# Patient Record
Sex: Male | Born: 1945 | Hispanic: No | Marital: Single | Smoking: Former smoker
Health system: Southern US, Community
[De-identification: ages and names within clinical notes are randomized; demographics above are authoritative.]

## PROBLEM LIST (undated history)

## (undated) DIAGNOSIS — I10 Essential (primary) hypertension: Secondary | ICD-10-CM

## (undated) DIAGNOSIS — F4310 Post-traumatic stress disorder, unspecified: Secondary | ICD-10-CM

## (undated) DIAGNOSIS — N189 Chronic kidney disease, unspecified: Secondary | ICD-10-CM

## (undated) DIAGNOSIS — Z89612 Acquired absence of left leg above knee: Secondary | ICD-10-CM

## (undated) DIAGNOSIS — L89159 Pressure ulcer of sacral region, unspecified stage: Secondary | ICD-10-CM

## (undated) DIAGNOSIS — R0600 Dyspnea, unspecified: Secondary | ICD-10-CM

## (undated) DIAGNOSIS — J189 Pneumonia, unspecified organism: Secondary | ICD-10-CM

## (undated) DIAGNOSIS — I2510 Atherosclerotic heart disease of native coronary artery without angina pectoris: Secondary | ICD-10-CM

## (undated) DIAGNOSIS — J449 Chronic obstructive pulmonary disease, unspecified: Secondary | ICD-10-CM

## (undated) DIAGNOSIS — D649 Anemia, unspecified: Secondary | ICD-10-CM

## (undated) DIAGNOSIS — E119 Type 2 diabetes mellitus without complications: Secondary | ICD-10-CM

---

## 2015-09-11 DIAGNOSIS — T148 Other injury of unspecified body region: Secondary | ICD-10-CM | POA: Insufficient documentation

## 2015-09-11 DIAGNOSIS — A4902 Methicillin resistant Staphylococcus aureus infection, unspecified site: Secondary | ICD-10-CM | POA: Insufficient documentation

## 2020-09-23 DIAGNOSIS — E43 Unspecified severe protein-calorie malnutrition: Secondary | ICD-10-CM | POA: Diagnosis present

## 2020-09-23 DIAGNOSIS — L89154 Pressure ulcer of sacral region, stage 4: Secondary | ICD-10-CM | POA: Diagnosis present

## 2020-09-23 DIAGNOSIS — J9621 Acute and chronic respiratory failure with hypoxia: Secondary | ICD-10-CM | POA: Diagnosis present

## 2020-09-23 DIAGNOSIS — N179 Acute kidney failure, unspecified: Secondary | ICD-10-CM

## 2020-09-23 DIAGNOSIS — U071 COVID-19: Secondary | ICD-10-CM

## 2020-09-23 DIAGNOSIS — A419 Sepsis, unspecified organism: Principal | ICD-10-CM

## 2020-09-23 DIAGNOSIS — Z89619 Acquired absence of unspecified leg above knee: Secondary | ICD-10-CM

## 2020-09-23 DIAGNOSIS — R0902 Hypoxemia: Secondary | ICD-10-CM | POA: Diagnosis present

## 2020-09-23 DIAGNOSIS — J189 Pneumonia, unspecified organism: Secondary | ICD-10-CM

## 2020-09-23 HISTORY — DX: Chronic obstructive pulmonary disease, unspecified: J44.9

## 2020-09-23 HISTORY — DX: Pneumonia, unspecified organism: J18.9

## 2020-09-23 HISTORY — DX: Atherosclerotic heart disease of native coronary artery without angina pectoris: I25.10

## 2020-09-23 HISTORY — DX: Essential (primary) hypertension: I10

## 2020-09-23 HISTORY — DX: Acquired absence of left leg above knee: Z89.612

## 2020-09-23 HISTORY — DX: Dyspnea, unspecified: R06.00

## 2020-09-23 HISTORY — DX: Chronic kidney disease, unspecified: N18.9

## 2020-09-23 HISTORY — DX: Post-traumatic stress disorder, unspecified: F43.10

## 2020-09-23 HISTORY — DX: Pressure ulcer of sacral region, unspecified stage: L89.159

## 2020-09-23 HISTORY — DX: Anemia, unspecified: D64.9

## 2020-09-23 HISTORY — DX: Type 2 diabetes mellitus without complications: E11.9

## 2020-09-23 LAB — CBC WITH DIFFERENTIAL/PLATELET
Basophils Relative: 0 %
Immature Granulocytes: 1 %
Lymphocytes Relative: 20 %
MCHC: 29.5 g/dL — ABNORMAL LOW (ref 30.0–36.0)
Monocytes Absolute: 0.9 10*3/uL (ref 0.1–1.0)
Monocytes Relative: 5 %
WBC: 17.6 10*3/uL — ABNORMAL HIGH (ref 4.0–10.5)

## 2020-09-23 LAB — COMPREHENSIVE METABOLIC PANEL
AST: 44 U/L — ABNORMAL HIGH (ref 15–41)
BUN: 60 mg/dL — ABNORMAL HIGH (ref 8–23)
GFR, Estimated: 14 mL/min — ABNORMAL LOW (ref >60)
Glucose, Bld: 126 mg/dL — ABNORMAL HIGH (ref 70–99)
Total Protein: 7.5 g/dL (ref 6.5–8.1)

## 2020-09-23 LAB — PROTIME-INR: Prothrombin Time: 15 seconds (ref 11.4–15.2)

## 2020-09-23 NOTE — Consult Note (Incomplete)
Reason for Consult:*** Referring Physician: ***  Steve Griffin. is an 75 y.o. male.  HPI: ***  Past Medical History:  Diagnosis Date  . CKD (chronic kidney disease)   . Diabetes mellitus without complication (HCC)   . Hx of AKA (above knee amputation), left (HCC)   . Hypertension   . Pressure ulcer, sacrum   . PTSD (post-traumatic stress disorder)     *** The histories are not reviewed yet. Please review them in the "History" navigator section and refresh this SmartLink.  No family history on file.  Social History:  reports that he has been smoking. He does not have any smokeless tobacco history on file. No history on file for alcohol use and drug use.  Allergies: Not on File  Medications: {medication reviewed/display:[id number]}  Results for orders placed or performed during the hospital encounter of 09/23/20 (from the past 48 hour(s))  Lactic acid, plasma     Status: None   Collection Time: 09/23/20 10:35 AM  Result Value Ref Range   Lactic Acid, Venous 1.5 0.5 - 1.9 mmol/L    Comment: Performed at [facility], [address] [place], [place] [address]  Comprehensive metabolic panel     Status: Abnormal   Collection Time: 09/23/20 10:35 AM  Result Value Ref Range   Sodium 136 135 - 145 mmol/L   Potassium 5.4 (H) 3.5 - 5.1 mmol/L   Chloride 102 98 - 111 mmol/L   CO2 21 (L) 22 - 32 mmol/L   Glucose, Bld 126 (H) 70 - 99 mg/dL    Comment: Glucose reference range applies only to samples taken after fasting for at least 8 hours.   BUN 60 (H) 8 - 23 mg/dL   Creatinine, Ser 0.97 (H) 0.61 - 1.24 mg/dL   Calcium 8.6 (L) 8.9 - 10.3 mg/dL   Total Protein 7.5 6.5 - 8.1 g/dL   Albumin 1.7 (L) 3.5 - 5.0 g/dL   AST 44 (H) 15 - 41 U/L   ALT 21 0 - 44 U/L   Alkaline Phosphatase 91 38 - 126 U/L   Total Bilirubin 0.5 0.3 - 1.2 mg/dL   GFR, Estimated 14 (L) >60 mL/min    Comment: (NOTE) Calculated using the CKD-EPI Creatinine Equation (2021)    Anion gap 13 5 - 15     Comment: Performed at [facility], [address] [place], [place] [address]  CBC WITH DIFFERENTIAL     Status: Abnormal   Collection Time: 09/23/20 10:35 AM  Result Value Ref Range   WBC 17.6 (H) 4.0 - 10.5 K/uL   RBC 2.84 (L) 4.22 - 5.81 MIL/uL   Hemoglobin 7.1 (L) 13.0 - 17.0 g/dL   HCT 92.4 (L) 26.8 - 34.1 %   MCV 84.9 80.0 - 100.0 fL   MCH 25.0 (L) 26.0 - 34.0 pg   MCHC 29.5 (L) 30.0 - 36.0 g/dL   RDW 96.2 (H) 22.9 - 79.8 %   Platelets 641 (H) 150 - 400 K/uL   nRBC 0.0 0.0 - 0.2 %   Neutrophils Relative % 74 %   Neutro Abs 12.8 (H) 1.7 - 7.7 K/uL   Lymphocytes Relative 20 %   Lymphs Abs 3.4 0.7 - 4.0 K/uL   Monocytes Relative 5 %   Monocytes Absolute 0.9 0.1 - 1.0 K/uL   Eosinophils Relative 0 %   Eosinophils Absolute 0.0 0.0 - 0.5 K/uL   Basophils Relative 0 %   Basophils Absolute 0.0 0.0 - 0.1  K/uL   WBC Morphology MORPHOLOGY UNREMARKABLE    RBC Morphology MIXED RBC MORPHOLOGY    Smear Review Normal platelet morphology    Immature Granulocytes 1 %   Abs Immature Granulocytes 0.23 (H) 0.00 - 0.07 K/uL    Comment: Performed at [facility], [address] [place], [place] [address]  Protime-INR     Status: None   Collection Time: 09/23/20 10:35 AM  Result Value Ref Range   Prothrombin Time 15.0 11.4 - 15.2 seconds   INR 1.2 0.8 - 1.2    Comment: (NOTE) INR goal varies based on device and disease states. Performed at [facility], [address] [place], [place] [address]   APTT     Status: Abnormal   Collection Time: 09/23/20 10:35 AM  Result Value Ref Range   aPTT 41 (H) 24 - 36 seconds    Comment:        IF BASELINE aPTT IS ELEVATED, SUGGEST PATIENT RISK ASSESSMENT BE USED TO DETERMINE APPROPRIATE ANTICOAGULANT THERAPY. Performed at [facility], [address] [place], [place] [address]   Ammonia     Status: None   Collection Time: 09/23/20 10:35 AM  Result Value Ref Range   Ammonia 20 9 - 35 umol/L     Comment: HEMOLYSIS AT THIS LEVEL MAY AFFECT RESULT Performed at [facility], [address] [place], [place] [address]   SARS Coronavirus 2 by RT PCR (hospital order, performed in [facility] hospital lab) Nasopharyngeal Nasopharyngeal Swab     Status: Abnormal   Collection Time: 09/23/20 10:35 AM   Specimen: Nasopharyngeal Swab  Result Value Ref Range   SARS Coronavirus 2 POSITIVE (A) NEGATIVE    Comment: RESULT CALLED TO, READ BACK BY AND VERIFIED WITH: REED RENO RN AT 1133 ON 09/23/20 SNG (NOTE) SARS-CoV-2 target nucleic acids are DETECTED  SARS-CoV-2 RNA is generally detectable in upper respiratory specimens  during the acute phase of infection.  Positive results are indicative  of the presence of the identified virus, but do not rule out bacterial infection or co-infection with other pathogens not detected by the test.  Clinical correlation with patient history and  other diagnostic information is necessary to determine patient infection status.  The expected result is negative.  Fact Sheet for Patients:   BoilerBrush.com.cy   Fact Sheet for Healthcare Providers:   https://pope.com/    This test is not yet approved or cleared by the [place] FDA and  has been authorized for detection and/or diagnosis of SARS-CoV-2 by FDA under an Emergency Use Authorization (EUA).  This EUA will remain in effect (meaning this te st can be used) for the duration of  the COVID-19 declaration under Section 564(b)(1) of the Act, 21 U.S.C. section 360-bbb-3(b)(1), unless the authorization is terminated or revoked sooner.  Performed at [facility], [address] [place], [place] [address]   Procalcitonin - Baseline     Status: None   Collection Time: 09/23/20 10:35 AM  Result Value Ref Range   Procalcitonin 1.91 ng/mL    Comment:        Interpretation: PCT > 0.5 ng/mL and <= 2 ng/mL: Systemic infection (sepsis) is  possible, but other conditions are known to elevate PCT as well. (NOTE)       Sepsis PCT Algorithm           Lower Respiratory Tract  Infection PCT Algorithm    ----------------------------     ----------------------------         PCT < 0.25 ng/mL                PCT < 0.10 ng/mL          Strongly encourage             Strongly discourage   discontinuation of antibiotics    initiation of antibiotics    ----------------------------     -----------------------------       PCT 0.25 - 0.50 ng/mL            PCT 0.10 - 0.25 ng/mL               OR       >80% decrease in PCT            Discourage initiation of                                            antibiotics      Encourage discontinuation           of antibiotics    ----------------------------     -----------------------------         PCT >= 0.50 ng/mL              PCT 0.26 - 0.50 ng/mL                AND       <80% decrease in PCT             Encourage initiation of                                             antibiotics       Encourage continuation           of antibiotics    ----------------------------     -----------------------------        PCT >= 0.50 ng/mL                  PCT > 0.50 ng/mL               AND         increase in PCT                  Strongly encourage                                      initiation of antibiotics    Strongly encourage escalation           of antibiotics                                     -----------------------------                                           PCT <= 0.25 ng/mL  OR                                        > 80% decrease in PCT                                      Discontinue / Do not initiate                                             antibiotics  Performed at [facility], [address] [place], [place] [address]   Blood gas, venous     Status: Abnormal (Preliminary result)    Collection Time: 09/23/20 10:42 AM  Result Value Ref Range   pH, Ven 7.30 7.250 - 7.430   pCO2, Ven 45 44.0 - 60.0 mmHg   pO2, Ven PENDING 32.0 - 45.0 mmHg   Bicarbonate 22.1 20.0 - 28.0 mmol/L   Acid-base deficit 4.0 (H) 0.0 - 2.0 mmol/L   O2 Saturation 25.8 %   Patient temperature 37.0    Sample type VENOUS     Comment: Performed at [facility], [address], [place], [place] [address]  CBG monitoring, ED     Status: Abnormal   Collection Time: 09/23/20 11:01 AM  Result Value Ref Range   Glucose-Capillary 280 (H) 70 - 99 mg/dL    Comment: Glucose reference range applies only to samples taken after fasting for at least 8 hours.    DG Chest Port 1 View  Result Date: 09/23/2020 CLINICAL DATA:  Respiratory distress and altered mental status. EXAM: PORTABLE CHEST 1 VIEW COMPARISON:  None. FINDINGS: Atherosclerotic calcification of the aortic arch. Tortuous thoracic aorta. Heart size within normal limits for projection. Abnormal blunting of the left lateral costophrenic angle. Linear bandlike scarring peripherally in the left lung base. There is some hazy density at the left lung base, possibly from layering pleural fluid. Volume loss at the left lung base. The right lung appears clear. IMPRESSION: 1. Hazy density at the left lung base, potentially from layering pleural fluid. Blunted left lateral costophrenic angle. 2. Scarring peripherally in the left lung base with some mild left basilar volume loss. 3. Tortuous thoracic aorta.  Aortic Atherosclerosis (ICD10-I70.0). Electronically Signed   By: Gaylyn Rong M.D.   On: 09/23/2020 11:23    Review of Systems Blood pressure 131/64, pulse 85, temperature (!) 97.1 F (36.2 C), temperature source Axillary, resp. rate (!) 22, weight 65.8 kg, SpO2 100 %. Physical Exam  Assessment/Plan: ***  Sarina Ser 09/23/2020, 3:42 PM

## 2020-09-26 LAB — CULTURE, BLOOD (SINGLE)

## 2020-11-15 DEATH — deceased

## 2021-09-22 IMAGING — CT CT CHEST W/O CM
2 of 3 series · 15 of 36 positions shown, 18 images · non-contrast
Comparison: 09/23/2020

CLINICAL DATA: Respiratory failure, D2LU6-D8 positive, renal
insufficiency, diabetes

EXAM:
CT CHEST WITHOUT CONTRAST
TECHNIQUE: Multidetector CT imaging of the chest was performed following the
standard protocol without IV contrast.

[Series 3: thorax · axial · 0.66mm/px · z∈[+49,+309]mm · 12 of 154 slices shown, 15 images]
[im 12/154  mediastinal]
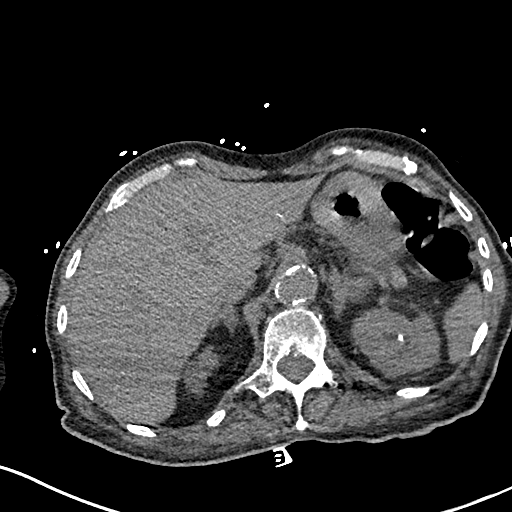
[im 12/154  lung]
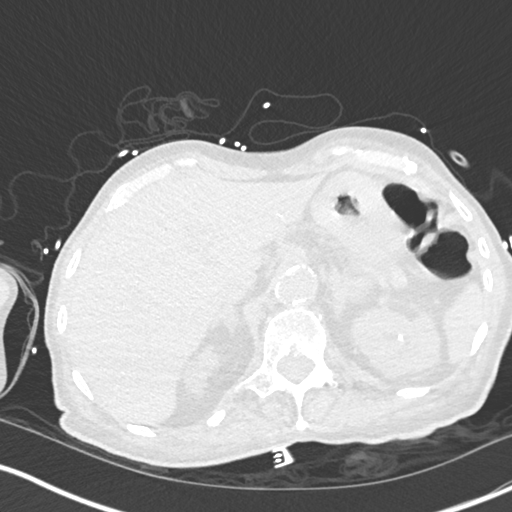
[im 23/154  lung]
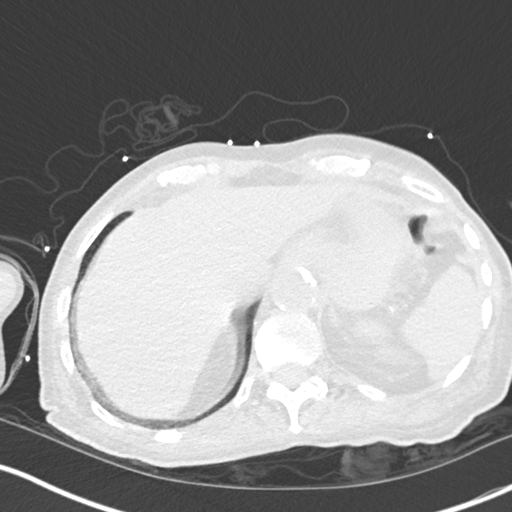
[im 35/154  lung]
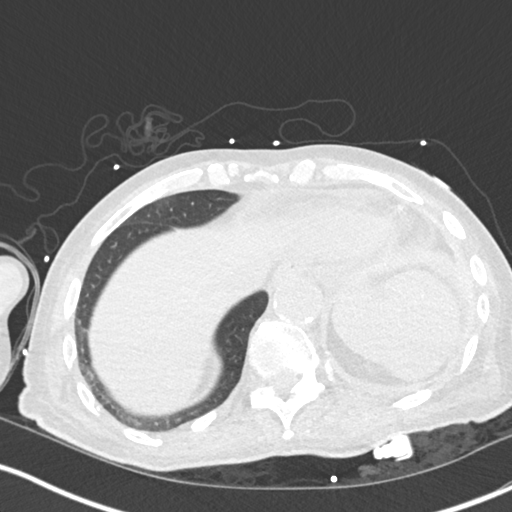
[im 46/154  lung]
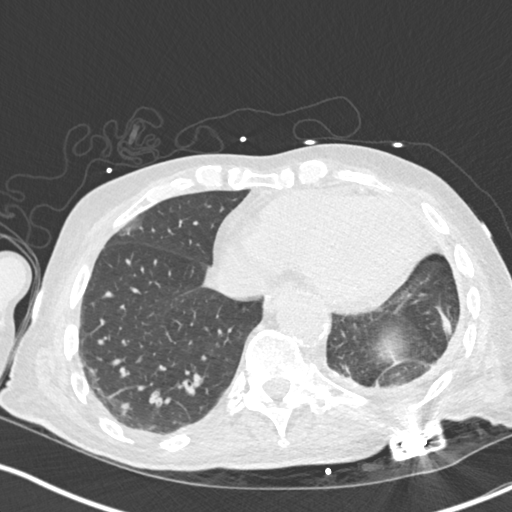
[im 57/154  mediastinal]
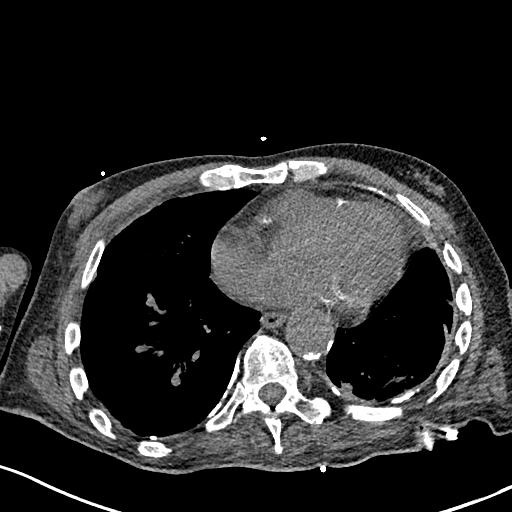
[im 57/154  lung]
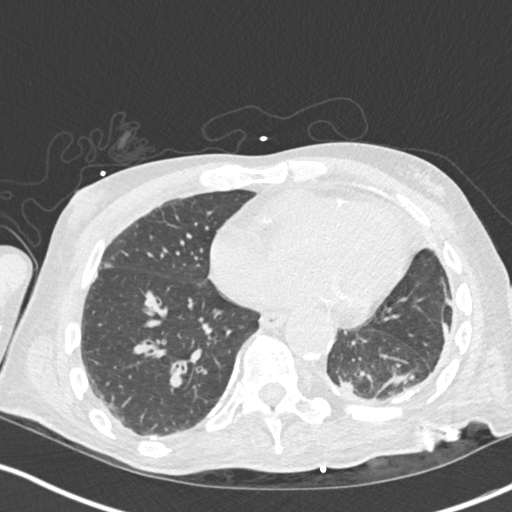
[im 69/154  lung]
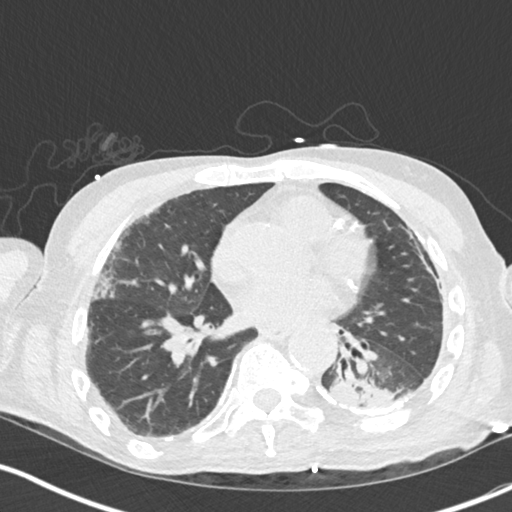
[im 86/154  lung]
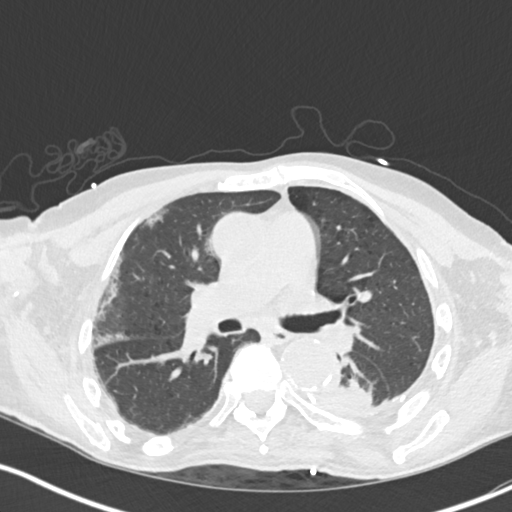
[im 97/154  lung]
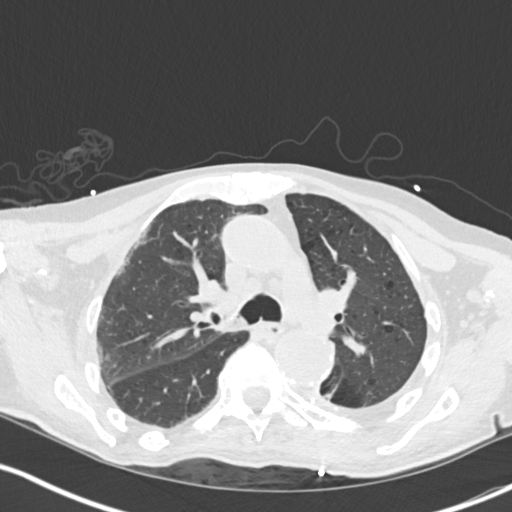
[im 108/154  mediastinal]
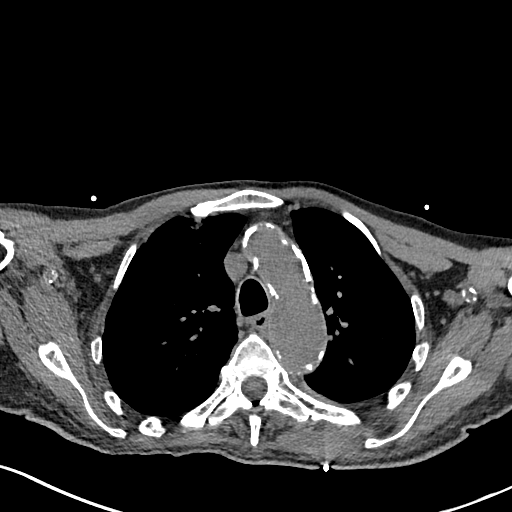
[im 108/154  lung]
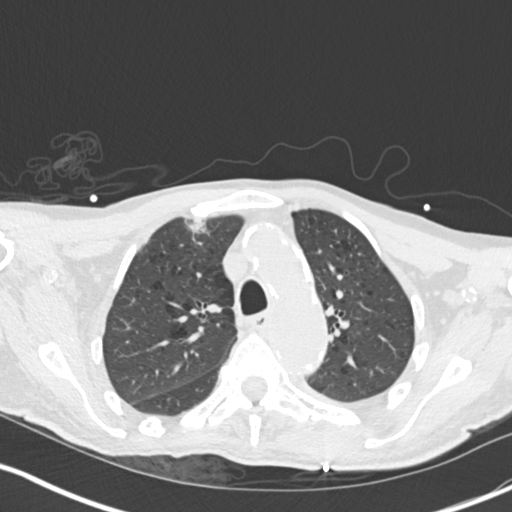
[im 120/154  lung]
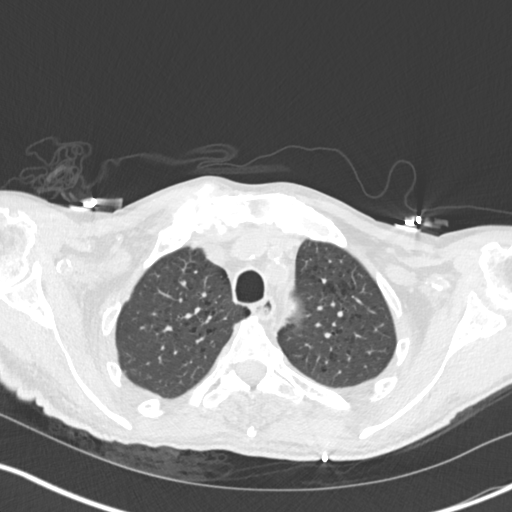
[im 131/154  lung]
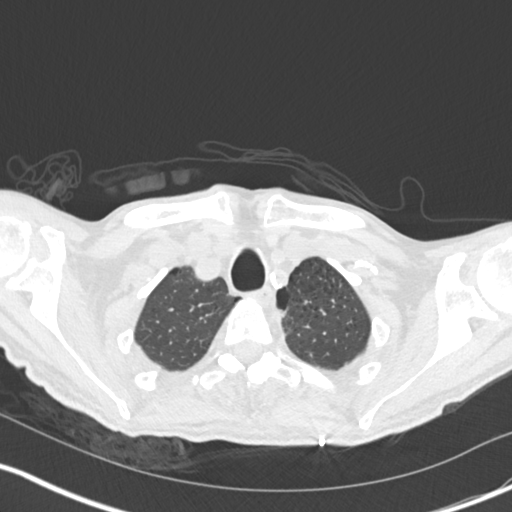
[im 142/154  lung]
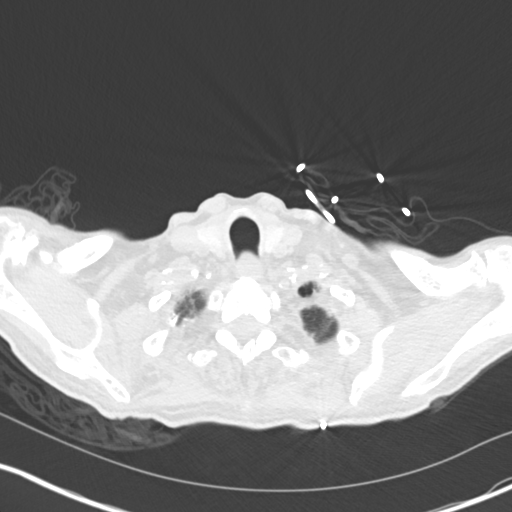

[Series 6: coronal · coronal · 0.63mm/px · 3 of 115 slices shown]
[im 23/115  lung]
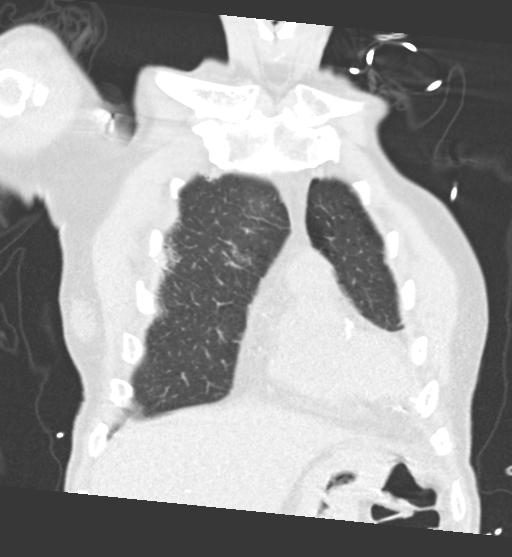
[im 46/115  lung]
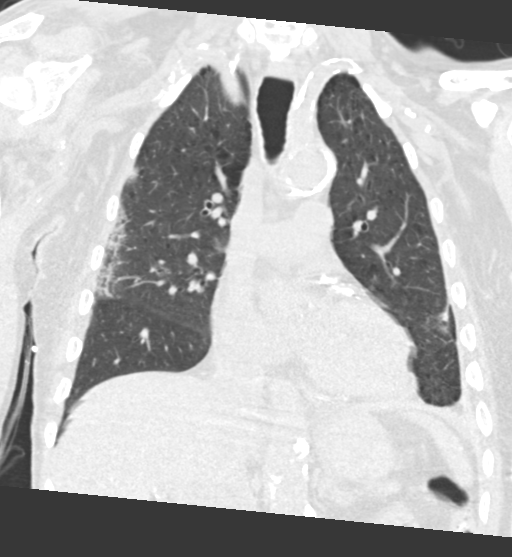
[im 69/115  lung]
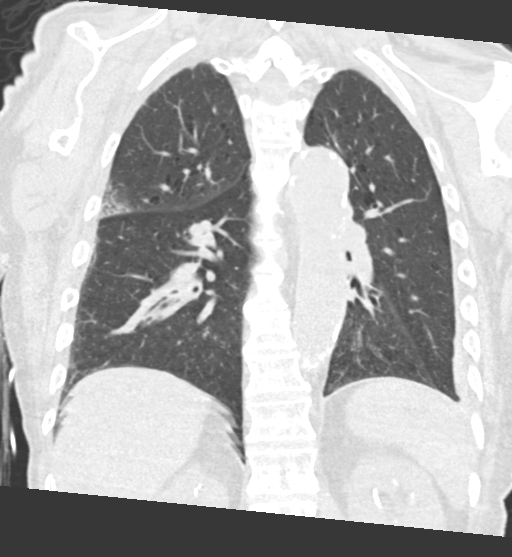

[15 of 36 positions shown; findings below may reference images not displayed]

FINDINGS: Cardiovascular: Unenhanced imaging of the heart and great vessels
demonstrates no significant pericardial effusion. No evidence of
thoracic aortic aneurysm. Evaluation of the aortic lumen is limited
without IV contrast. Diffuse atherosclerosis of the aorta and
coronary vasculature.

Mediastinum/Nodes: No enlarged mediastinal or axillary lymph nodes.
Thyroid gland, trachea, and esophagus demonstrate no significant
findings.

Lungs/Pleura: Upper lobe predominant emphysema. There are scattered
areas of subpleural ground-glass airspace disease within the right
upper and right middle lobes, compatible with early infection.

There is circumferential left pleural thickening and calcification,
consistent with prior surgery or trauma. Rounded consolidation
within the left lower lobe most compatible with rounded atelectasis.

No effusion or pneumothorax.

There is mucoid material within the right mainstem bronchus. Right
lower lobe bronchial wall thickening is identified consistent with
bronchitis or reactive airway disease.

Upper Abdomen: No acute abnormality.

Musculoskeletal: No acute or destructive bony lesions.
IMPRESSION: 1. Upper lobe predominant emphysema, with patchy right upper and
right middle lobe subpleural ground-glass airspace disease
compatible with early infection or inflammation.
2. Right lower lobe bronchial wall thickening consistent with
bronchitis or reactive airway disease.
3. Left pleural thickening and calcification consistent with
previous trauma or surgery. Rounded left lower lobe consolidation
most compatible with rounded atelectasis.
4. Aortic Atherosclerosis (LHATV-M5C.C) and Emphysema (LHATV-D0O.H).
